# Patient Record
Sex: Male | Born: 1980 | Race: White | Hispanic: No | Marital: Married | Smoking: Former smoker
Health system: Southern US, Community
[De-identification: ages and names within clinical notes are randomized; demographics above are authoritative.]

## PROBLEM LIST (undated history)

## (undated) DIAGNOSIS — R519 Headache, unspecified: Secondary | ICD-10-CM

## (undated) DIAGNOSIS — G43909 Migraine, unspecified, not intractable, without status migrainosus: Secondary | ICD-10-CM

## (undated) DIAGNOSIS — R51 Headache: Secondary | ICD-10-CM

## (undated) HISTORY — PX: OTHER SURGICAL HISTORY: SHX169

---

## 2008-06-23 ENCOUNTER — Emergency Department: Payer: Self-pay | Admitting: Emergency Medicine

## 2010-02-19 ENCOUNTER — Emergency Department: Payer: Self-pay | Admitting: Emergency Medicine

## 2013-03-24 ENCOUNTER — Ambulatory Visit: Payer: Self-pay | Admitting: Family Medicine

## 2013-03-26 ENCOUNTER — Ambulatory Visit: Payer: Self-pay | Admitting: Emergency Medicine

## 2013-03-28 ENCOUNTER — Ambulatory Visit: Payer: Self-pay | Admitting: Internal Medicine

## 2013-03-30 ENCOUNTER — Ambulatory Visit: Payer: Self-pay | Admitting: Physician Assistant

## 2013-04-01 ENCOUNTER — Ambulatory Visit: Payer: Self-pay | Admitting: Physician Assistant

## 2013-04-03 ENCOUNTER — Ambulatory Visit: Payer: Self-pay | Admitting: Family Medicine

## 2014-03-10 ENCOUNTER — Ambulatory Visit: Payer: Self-pay

## 2014-04-17 ENCOUNTER — Ambulatory Visit: Payer: Self-pay | Admitting: Physician Assistant

## 2014-10-21 ENCOUNTER — Ambulatory Visit
Admission: EM | Admit: 2014-10-21 | Discharge: 2014-10-21 | Disposition: A | Discharge status: Home / Self Care | Payer: No Typology Code available for payment source | Attending: Internal Medicine | Admitting: Internal Medicine

## 2014-10-21 DIAGNOSIS — G43009 Migraine without aura, not intractable, without status migrainosus: Secondary | ICD-10-CM | POA: Diagnosis not present

## 2014-10-21 MED ORDER — SUMATRIPTAN SUCCINATE 6 MG/0.5ML ~~LOC~~ SOLN
6.0000 mg | Freq: Once | SUBCUTANEOUS | Status: AC
  Administered 2014-10-21: 6 mg via SUBCUTANEOUS

## 2014-10-21 MED ORDER — SUMATRIPTAN SUCCINATE 6 MG/0.5ML ~~LOC~~ SOAJ: 6.0000 mg | Freq: Once | SUBCUTANEOUS | Status: DC | PRN

## 2014-10-21 NOTE — ED Notes (Signed)
States started with headache left temporal area at 11am today. No N/V. States light bothersome

## 2014-10-21 NOTE — Discharge Instructions (Signed)
Prescription for imitrex.  If needing to use imitrex (or similar meds) more than 2-3 times per month, might benefit from a suppressive medication.  A primary care provider can help with this.  Recurrent Migraine Headache A migraine headache is an intense, throbbing pain on one or both sides of your head. Recurrent migraines keep coming back. A migraine can last for 30 minutes to several hours. CAUSES  The exact cause of a migraine headache is not always known. However, a migraine may be caused when nerves in the brain become irritated and release chemicals that cause inflammation. This causes pain. Certain things may also trigger migraines, such as:   Alcohol.  Smoking.  Stress.  Menstruation.  Aged cheeses.  Foods or drinks that contain nitrates, glutamate, aspartame, or tyramine.  Lack of sleep.  Chocolate.  Caffeine.  Hunger.  Physical exertion.  Fatigue.  Medicines used to treat chest pain (nitroglycerine), birth control pills, estrogen, and some blood pressure medicines. SYMPTOMS   Pain on one or both sides of your head.  Pulsating or throbbing pain.  Severe pain that prevents daily activities.  Pain that is aggravated by any physical activity.  Nausea, vomiting, or both.  Dizziness.  Pain with exposure to bright lights, loud noises, or activity.  General sensitivity to bright lights, loud noises, or smells. Before you get a migraine, you may get warning signs that a migraine is coming (aura). An aura may include:  Seeing flashing lights.  Seeing bright spots, halos, or zigzag lines.  Having tunnel vision or blurred vision.  Having feelings of numbness or tingling.  Having trouble talking.  Having muscle weakness. DIAGNOSIS  A recurrent migraine headache is often diagnosed based on:  Symptoms.  Physical examination.  A CT scan or MRI of your head. These imaging tests cannot diagnose migraines but can help rule out other causes of headaches.   TREATMENT  Medicines may be given for pain and nausea. Medicines can also be given to help prevent recurrent migraines. HOME CARE INSTRUCTIONS  Only take over-the-counter or prescription medicines for pain or discomfort as directed by your health care provider. The use of long-term narcotics is not recommended.  Lie down in a dark, quiet room when you have a migraine.  Keep a journal to find out what may trigger your migraine headaches. For example, write down:  What you eat and drink.  How much sleep you get.  Any change to your diet or medicines.  Limit alcohol consumption.  Quit smoking if you smoke.  Get 7-9 hours of sleep, or as recommended by your health care provider.  Limit stress.  Keep lights dim if bright lights bother you and make your migraines worse. SEEK MEDICAL CARE IF:   You do not get relief from the medicines given to you.  You have a recurrence of pain.  You have a fever. SEEK IMMEDIATE MEDICAL CARE IF:  Your migraine becomes severe.  You have a stiff neck.  You have loss of vision.  You have muscular weakness or loss of muscle control.  You start losing your balance or have trouble walking.  You feel faint or pass out.  You have severe symptoms that are different from your first symptoms. MAKE SURE YOU:   Understand these instructions.  Will watch your condition.  Will get help right away if you are not doing well or get worse. Document Released: 02/06/2001 Document Revised: 09/28/2013 Document Reviewed: 01/19/2013 [facility] Patient Information 2015 [place], [place]. This information is not  intended to replace advice given to you by your health care provider. Make sure you discuss any questions you have with your health care provider. ° °

## 2014-10-21 NOTE — ED Provider Notes (Signed)
CSN: [id number]     Arrival date & time 10/21/14  1846 History   First MD Initiated Contact with Patient 10/21/14 1941     Chief Complaint  Patient presents with  . Headache   HPI  Frequent headaches, this one is similar to previous.  No weakness/clumsiness, no loss of balance, able to drive self to urgent care and walk in independently.  Photophobic.  No speech/swallowing trouble.  No fever.  Current headache started 11am today, located at L temple.  Has used triptans successfully before.  No N/V.    Past Medical History  Diagnosis Date  . Migraine    History reviewed. No pertinent past surgical history. History reviewed. No pertinent family history. History  Substance Use Topics  . Smoking status: Former Games developer  . Smokeless tobacco: Current User  . Alcohol Use: No    Review of Systems  All other systems reviewed and are negative.   Allergies  Review of patient's allergies indicates no known allergies.  Home Medications   Prior to Admission medications   Not on File   BP 122/82 mmHg  Pulse 64  Temp(Src) 96.5 F (35.8 C) (Tympanic)  Resp 16  Ht  (1.854 m)  Wt 153 lb (69.4 kg)  BMI 20.19 kg/m2  SpO2 100% Physical Exam  Constitutional: He is oriented to person, place, and time. No distress.  Alert, nicely groomed  HENT:  Head: Atraumatic.  Eyes:  Conjugate gaze, no eye redness/drainage Wearing dark glasses  Neck: Neck supple.  Cardiovascular: Normal rate.   Pulmonary/Chest: No respiratory distress.  Lungs clear, symmetric breath sounds  Abdominal: Soft. He exhibits no distension.  Musculoskeletal: Normal range of motion.  Neurological: He is alert and oriented to person, place, and time.  Walked into urgent care independently.   Face symmetric. Speech clear/coherent.  Skin: Skin is warm and dry.  No cyanosis Tanned  Nursing note and vitals reviewed.   ED Course  Procedures (including critical care time) Labs Review Labs Reviewed - No data to  display  Imaging Review No results found.   imitrex  [place] injection given at urgent care for migraine, with complete relief of headache.   MDM   1. Nonintractable migraine, unspecified migraine type    rx for imitrex injections prn. Followup with a pcp may be beneficial to discuss migraine management plan.   Eustace Moore, MD 10/21/14 2017

## 2015-05-18 ENCOUNTER — Other Ambulatory Visit: Payer: Self-pay | Admitting: Neurology

## 2015-05-18 DIAGNOSIS — G44229 Chronic tension-type headache, not intractable: Secondary | ICD-10-CM

## 2015-06-09 ENCOUNTER — Ambulatory Visit: Payer: BLUE CROSS/BLUE SHIELD

## 2015-06-29 ENCOUNTER — Ambulatory Visit
Admission: RE | Admit: 2015-06-29 | Discharge: 2015-06-29 | Disposition: A | Discharge status: Home / Self Care | Payer: BLUE CROSS/BLUE SHIELD | Source: Ambulatory Visit | Attending: Neurology | Admitting: Neurology

## 2015-06-29 DIAGNOSIS — R9082 White matter disease, unspecified: Secondary | ICD-10-CM | POA: Insufficient documentation

## 2015-06-29 DIAGNOSIS — G44229 Chronic tension-type headache, not intractable: Secondary | ICD-10-CM | POA: Insufficient documentation

## 2015-07-14 ENCOUNTER — Other Ambulatory Visit: Payer: Self-pay | Admitting: Neurology

## 2015-07-14 DIAGNOSIS — G35 Multiple sclerosis: Secondary | ICD-10-CM

## 2015-07-25 ENCOUNTER — Observation Stay
Admission: RE | Admit: 2015-07-25 | Discharge: 2015-07-27 | Disposition: A | Discharge status: Home / Self Care | Payer: BLUE CROSS/BLUE SHIELD | Source: Ambulatory Visit | Attending: Internal Medicine | Admitting: Internal Medicine

## 2015-07-25 ENCOUNTER — Ambulatory Visit
Admission: RE | Admit: 2015-07-25 | Discharge: 2015-07-25 | Disposition: A | Discharge status: Home / Self Care | Payer: BLUE CROSS/BLUE SHIELD | Source: Ambulatory Visit | Attending: Neurology | Admitting: Neurology

## 2015-07-25 VITALS — BP 113/73 | HR 67 | Temp 97.8°F | Resp 20 | Ht 73.0 in | Wt 165.0 lb

## 2015-07-25 DIAGNOSIS — Z79899 Other long term (current) drug therapy: Secondary | ICD-10-CM | POA: Diagnosis not present

## 2015-07-25 DIAGNOSIS — Z825 Family history of asthma and other chronic lower respiratory diseases: Secondary | ICD-10-CM | POA: Diagnosis not present

## 2015-07-25 DIAGNOSIS — R9082 White matter disease, unspecified: Secondary | ICD-10-CM | POA: Diagnosis not present

## 2015-07-25 DIAGNOSIS — Z87891 Personal history of nicotine dependence: Secondary | ICD-10-CM | POA: Insufficient documentation

## 2015-07-25 DIAGNOSIS — M62830 Muscle spasm of back: Secondary | ICD-10-CM | POA: Diagnosis not present

## 2015-07-25 DIAGNOSIS — G35 Multiple sclerosis: Secondary | ICD-10-CM | POA: Diagnosis present

## 2015-07-25 DIAGNOSIS — M549 Dorsalgia, unspecified: Secondary | ICD-10-CM | POA: Insufficient documentation

## 2015-07-25 DIAGNOSIS — M545 Low back pain: Secondary | ICD-10-CM | POA: Insufficient documentation

## 2015-07-25 DIAGNOSIS — G43909 Migraine, unspecified, not intractable, without status migrainosus: Secondary | ICD-10-CM | POA: Diagnosis not present

## 2015-07-25 DIAGNOSIS — M542 Cervicalgia: Secondary | ICD-10-CM | POA: Insufficient documentation

## 2015-07-25 DIAGNOSIS — M4806 Spinal stenosis, lumbar region: Secondary | ICD-10-CM | POA: Insufficient documentation

## 2015-07-25 DIAGNOSIS — Z9889 Other specified postprocedural states: Secondary | ICD-10-CM | POA: Diagnosis not present

## 2015-07-25 DIAGNOSIS — M5186 Other intervertebral disc disorders, lumbar region: Secondary | ICD-10-CM | POA: Diagnosis not present

## 2015-07-25 DIAGNOSIS — F17220 Nicotine dependence, chewing tobacco, uncomplicated: Secondary | ICD-10-CM | POA: Insufficient documentation

## 2015-07-25 DIAGNOSIS — M546 Pain in thoracic spine: Secondary | ICD-10-CM

## 2015-07-25 HISTORY — DX: Headache, unspecified: R51.9

## 2015-07-25 HISTORY — DX: Migraine, unspecified, not intractable, without status migrainosus: G43.909

## 2015-07-25 HISTORY — DX: Headache: R51

## 2015-07-25 LAB — CSF CELL COUNT WITH DIFFERENTIAL
RBC Count, CSF: 0 /mm3 (ref 0–3)
RBC Count, CSF: 3 /mm3 (ref 0–3)
TUBE #: 3
TUBE #: 4
WBC, CSF: 0 /mm3
WBC, CSF: 0 /mm3

## 2015-07-25 LAB — CBC
HCT: 47 % (ref 40.0–52.0)
HEMOGLOBIN: 16 g/dL (ref 13.0–18.0)
MCH: 31.5 pg (ref 26.0–34.0)
MCHC: 34.1 g/dL (ref 32.0–36.0)
MCV: 92.6 fL (ref 80.0–100.0)
PLATELETS: 229 10*3/uL (ref 150–440)
RBC: 5.07 MIL/uL (ref 4.40–5.90)
RDW: 13.5 % (ref 11.5–14.5)
WBC: 5.4 10*3/uL (ref 3.8–10.6)

## 2015-07-25 LAB — ALBUMIN: Albumin: 4.5 g/dL (ref 3.5–5.0)

## 2015-07-25 LAB — GLUCOSE, CSF: Glucose, CSF: 63 mg/dL (ref 40–70)

## 2015-07-25 LAB — PROTIME-INR
INR: 0.93
PROTHROMBIN TIME: 12.7 s (ref 11.4–15.0)

## 2015-07-25 LAB — APTT: aPTT: 29 seconds (ref 24–36)

## 2015-07-25 LAB — PROTEIN, CSF: TOTAL PROTEIN, CSF: 23 mg/dL (ref 15–45)

## 2015-07-25 LAB — C-REACTIVE PROTEIN: CRP: <0.5 mg/dL (ref <1.0)

## 2015-07-25 LAB — SEDIMENTATION RATE: Sed Rate: 1 mm/hr (ref 0–15)

## 2015-07-25 MED ORDER — OXYCODONE HCL 5 MG PO TABS
5.0000 mg | ORAL_TABLET | ORAL | Status: DC | PRN
  Administered 2015-07-25 – 2015-07-27 (×5): 5 mg via ORAL
  Filled 2015-07-25 (×7): qty 1

## 2015-07-25 MED ORDER — OXYCODONE-ACETAMINOPHEN 5-325 MG PO TABS
1.0000 | ORAL_TABLET | Freq: Once | ORAL | Status: AC
  Administered 2015-07-25: 1 via ORAL
  Filled 2015-07-25: qty 1

## 2015-07-25 MED ORDER — MORPHINE SULFATE (PF) 2 MG/ML IV SOLN
2.0000 mg | INTRAVENOUS | Status: DC | PRN
  Administered 2015-07-25 – 2015-07-27 (×2): 2 mg via INTRAVENOUS
  Filled 2015-07-25 (×2): qty 1

## 2015-07-25 MED ORDER — OXYCODONE HCL 5 MG PO TABS
5.0000 mg | ORAL_TABLET | Freq: Once | ORAL | Status: AC
  Administered 2015-07-25: 5 mg via ORAL

## 2015-07-25 MED ORDER — ACETAMINOPHEN 650 MG RE SUPP
650.0000 mg | Freq: Four times a day (QID) | RECTAL | Status: DC | PRN
  Filled 2015-07-25: qty 1

## 2015-07-25 MED ORDER — CYCLOBENZAPRINE HCL 10 MG PO TABS
5.0000 mg | ORAL_TABLET | Freq: Three times a day (TID) | ORAL | Status: DC | PRN
  Administered 2015-07-25 – 2015-07-26 (×2): 5 mg via ORAL
  Filled 2015-07-25: qty 1
  Filled 2015-07-25: qty 0.5
  Filled 2015-07-25 (×2): qty 1

## 2015-07-25 MED ORDER — ACETAMINOPHEN 325 MG PO TABS
650.0000 mg | ORAL_TABLET | Freq: Four times a day (QID) | ORAL | Status: DC | PRN
  Filled 2015-07-25: qty 2

## 2015-07-25 MED ORDER — DIAZEPAM 5 MG PO TABS
5.0000 mg | ORAL_TABLET | Freq: Three times a day (TID) | ORAL | Status: DC | PRN
Start: 2015-07-25 — End: 2015-07-26
  Administered 2015-07-25: 5 mg via ORAL
  Filled 2015-07-25: qty 1

## 2015-07-25 MED ORDER — INFLUENZA VAC SPLIT QUAD 0.5 ML IM SUSY: 0.5000 mL | PREFILLED_SYRINGE | INTRAMUSCULAR | Status: DC

## 2015-07-25 MED ORDER — SUMATRIPTAN SUCCINATE 50 MG PO TABS
100.0000 mg | ORAL_TABLET | ORAL | Status: DC | PRN
  Administered 2015-07-26: 100 mg via ORAL
  Filled 2015-07-25 (×2): qty 2

## 2015-07-25 NOTE — OR Nursing (Signed)
Dr Register called, after pt unable to stand up to walk to bathroom, legs keep giving out. Unable to bear weight

## 2015-07-25 NOTE — Progress Notes (Signed)
Patient ID: Jordan Richards, male   DOB: [birth year], 35 y.o.   MRN: [id number] Pt. experenced sever back pain following L.P.Pt had difficulting walking and voiding secondary to pain.Pain localized to back,no radiation.no paresthesias.Vss.Neuro exam normal.Discussed with Dr. Dagoberto Reef consulted to further evaluate and manage pain.Thankyou-we are available as needed.

## 2015-07-25 NOTE — OR Nursing (Signed)
Able to void using urinal

## 2015-07-25 NOTE — Consult Note (Signed)
Reason for Consult:Back pain Referring Physician: Renae Gloss  CC: Back pain  HPI: Jordan Richards is an 35 y.o. male with a history of headaches who was at [facility] today to have an LP as an outpatient.  Procedure was without complications.  After the procedure when patient attempted to stand had severe back pain at the site of the procedure.  Patient was able to void.  Reports no changes in sensation in his legs and is able to move them, although painful, while in the lying position.    Past Medical History  Diagnosis Date  . Headache   . Migraines     Past Surgical History  Procedure Laterality Date  . Surgery as a child      Family History  Problem Relation Age of Onset  . COPD Mother   . Healthy Father     Social History:  reports that he has quit smoking. He uses smokeless tobacco. He reports that he does not drink alcohol or use illicit drugs.  No Known Allergies  Medications: I have reviewed the patient's current medications. Prior to Admission:  Prior to Admission medications   Medication Sig Start Date End Date Taking? Authorizing Provider  SUMAtriptan (IMITREX) 100 MG tablet Take 100 mg by mouth every 2 (two) hours as needed for migraine. May repeat in 2 hours if headache persists or recurs.   Yes Historical Provider, MD    ROS: History obtained from the patient  General ROS: negative for - chills, fatigue, fever, night sweats, weight gain or weight loss Psychological ROS: negative for - behavioral disorder, hallucinations, memory difficulties, mood swings or suicidal ideation Ophthalmic ROS: negative for - blurry vision, double vision, eye pain or loss of vision ENT ROS: negative for - epistaxis, nasal discharge, oral lesions, sore throat, tinnitus or vertigo Allergy and Immunology ROS: negative for - hives or itchy/watery eyes Hematological and Lymphatic ROS: negative for - bleeding problems, bruising or swollen lymph nodes Endocrine ROS: negative for - galactorrhea,  hair pattern changes, polydipsia/polyuria or temperature intolerance Respiratory ROS: negative for - cough, hemoptysis, shortness of breath or wheezing Cardiovascular ROS: negative for - chest pain, dyspnea on exertion, edema or irregular heartbeat Gastrointestinal ROS: negative for - abdominal pain, diarrhea, hematemesis, nausea/vomiting or stool incontinence Genito-Urinary ROS: negative for - dysuria, hematuria, incontinence or urinary frequency/urgency Musculoskeletal ROS: negative for - joint swelling or muscular weakness Neurological ROS: as noted in HPI Dermatological ROS: negative for rash and skin lesion changes  Physical Examination: Blood pressure 113/74, pulse 73, temperature 97.6 F (36.4 C), resp. rate 16, height 6\' 1"  (1.854 m), weight 74.844 kg (165 lb), SpO2 97 %.  HEENT-  Normocephalic, no lesions, without obvious abnormality.  Normal external eye and conjunctiva.  Normal TM's bilaterally.  Normal auditory canals and external ears. Normal external nose, mucus membranes and septum.  Normal pharynx. Cardiovascular- S1, S2 normal, pulses palpable throughout   Lungs- chest clear, no wheezing, rales, normal symmetric air entry Abdomen- soft, non-tender; bowel sounds normal; no masses,  no organomegaly Extremities- no edema Lymph-no adenopathy palpable Musculoskeletal-no joint tenderness, deformity or swelling.  No pain on palpation of the spinous processes or paraspinal musculature.   Skin-warm and dry, no hyperpigmentation, vitiligo, or suspicious lesions  Neurological Examination Mental Status: Alert, oriented, thought content appropriate.  Speech fluent without evidence of aphasia.  Able to follow 3 step commands without difficulty. Motor: Right : Upper extremity   5/5    Left:     Upper extremity  5/5 Able to move all muscle groups in the lower extremities against gravity but patient guarded due to pain.   Sensory: Pinprick and light touch intact throughout,  bilaterally Deep Tendon Reflexes: 2+ and symmetric throughout Plantars: Right: downgoing   Left: downgoing Gait: Not tested due to pain and safety concerns  Laboratory Studies:   Basic Metabolic Panel: No results for input(s): NA, K, CL, CO2, GLUCOSE, BUN, CREATININE, CALCIUM, MG, PHOS in the last 168 hours.  Liver Function Tests:  Recent Labs Lab 07/25/15 0751  ALBUMIN 4.5   No results for input(s): LIPASE, AMYLASE in the last 168 hours. No results for input(s): AMMONIA in the last 168 hours.  CBC:  Recent Labs Lab 07/25/15 0751  WBC 5.4  HGB 16.0  HCT 47.0  MCV 92.6  PLT 229    Cardiac Enzymes: No results for input(s): CKTOTAL, CKMB, CKMBINDEX, TROPONINI in the last 168 hours.  BNP: Invalid input(s): POCBNP  CBG: No results for input(s): GLUCAP in the last 168 hours.  Microbiology: Results for orders placed or performed during the hospital encounter of 07/25/15  CSF culture     Status: None (Preliminary result)   Collection Time: 07/25/15  8:20 AM  Result Value Ref Range Status   Specimen Description CSF  Final   Special Requests NONE  Final   Gram Stain RARE WBC SEEN NO ORGANISMS SEEN   Final   Culture PENDING  Incomplete   Report Status PENDING  Incomplete    Coagulation Studies:  Recent Labs  07/25/15 0751  LABPROT 12.7  INR 0.93    Urinalysis: No results for input(s): COLORURINE, LABSPEC, PHURINE, GLUCOSEU, HGBUR, BILIRUBINUR, KETONESUR, PROTEINUR, UROBILINOGEN, NITRITE, LEUKOCYTESUR in the last 168 hours.  Invalid input(s): APPERANCEUR  Lipid Panel:  No results found for: CHOL, TRIG, HDL, CHOLHDL, VLDL, LDLCALC  HgbA1C: No results found for: HGBA1C  Urine Drug Screen:  No results found for: LABOPIA, COCAINSCRNUR, LABBENZ, AMPHETMU, THCU, LABBARB  Alcohol Level: No results for input(s): ETH in the last 168 hours.   Imaging: Dg Fluoro Guided Loc Of Needle/cath Tip For Spinal Inject Lt  07/25/2015  CLINICAL DATA:  Headaches. EXAM:  DIAGNOSTIC LUMBAR PUNCTURE UNDER FLUOROSCOPIC GUIDANCE FLUOROSCOPY TIME:  Radiation Exposure Index (as provided by the fluoroscopic device): 14.5 mGy PROCEDURE: Informed consent was obtained from the patient prior to the procedure, including potential complications of headache, allergy, and pain. With the patient prone, the lower back was prepped with Betadine. 1% Lidocaine was used for local anesthesia. Lumbar puncture was performed at the L3-L4 level using a 22 gauge needle with return of clear CSF with an opening pressure of 14 cm water. Eight ml of CSF were obtained for laboratory studies. The patient tolerated the procedure well and there were no apparent complications. IMPRESSION: Successful fluoroscopically directed lumbar puncture. Electronically Signed   By: Maisie Fus  Register   On: 07/25/2015 08:59     Assessment/Plan: 35 year old male presenting for an LP to rule out MS.  After procedure with severe back pain.  No evidence of bladder incontinence.  No sensory findings.  Deep tendon reflexes are normal.  Due to severity of pain will rule out a hemorrhage related to the procedure.    Recommendations: 1.  CT of the lumbar spine.  Will follow up after above imaging.   2.  Valium 5-10 mg for prn for pain    Thana Farr, MD Neurology [phone] 07/25/2015, 1:14 PM   Addendum: CT of the lumbar spine personally reviewed and  shows no pathology that would be the etiology of the patient's pain.  Suspect pain is muscular.  Would continue with plan for muscle relaxer.    Thana Farr, MD Neurology [phone]

## 2015-07-25 NOTE — OR Nursing (Signed)
Dr register to assess pt, ordered another percocet

## 2015-07-25 NOTE — OR Nursing (Signed)
Pain continuing, still unable to bear weight to void, Dr register reassessed pt and Dr Malvin Johns office called, per Dr register request.

## 2015-07-25 NOTE — H&P (Signed)
Jordan Richards at [place] NAME: Jordan Richards    MR#:  [id number]  DATE OF BIRTH:  Nov 03, 1980  DATE OF ADMISSION:  07/25/2015  PRIMARY CARE PHYSICIAN: Cecile Sheerer, MD   REQUESTING/REFERRING PHYSICIAN: Dr Elige Radon  CHIEF COMPLAINT:  Back pain  HISTORY OF PRESENT ILLNESS:  Jordan Richards  is a 35 y.o. male with a known history of migraines. He follows with Dr. Melrose Nakayama neurology who obtained an MRI of the brain and they saw an area that they could not rule out multiple sclerosis and he was sent for spinal tap. He had a spinal tap today that was done by Dr. register under fluoroscopy. His opening pressure was normal and the procedure was uncomplicated as per Dr. Production manager. The patient had severe back pain and spasms after the procedure. He could not even stand up and bending or twisting has him in severe pain. He has trouble urinating sitting down and he could not even stand up to urinate. He eventually urinated. He does not have any pain radiating down his legs and sensation is normal in his legs. But when he moves his legs he does have severe pain in his back around the site of the lumbar puncture. Back pain severe 10 out of 10 intensity when moving around. Localized) at lumbar puncture site.  PAST MEDICAL HISTORY:   Past Medical History  Diagnosis Date  . Headache   . Migraines     PAST SURGICAL HISTORY:   Past Surgical History  Procedure Laterality Date  . Surgery as a child      SOCIAL HISTORY:   Social History  Substance Use Topics  . Smoking status: Former Research scientist (life sciences)  . Smokeless tobacco: Current User  . Alcohol Use: No    FAMILY HISTORY:   Family History  Problem Relation Age of Onset  . COPD Mother   . Healthy Father     DRUG ALLERGIES:  No Known Allergies  REVIEW OF SYSTEMS:  CONSTITUTIONAL: No fever, fatigue or weakness. Some weight gain EYES: No blurred or double vision. Weakness in the right eye EARS,  NOSE, AND THROAT: No tinnitus or ear pain. No sore throat RESPIRATORY: No cough, some shortness of breath, no wheezing or hemoptysis.  CARDIOVASCULAR: No chest pain, orthopnea, edema.  GASTROINTESTINAL: No nausea, vomiting, diarrhea or abdominal pain. No blood in bowel movements GENITOURINARY: No dysuria, hematuria. Trouble urinating and less he is standing up. Unable to stand secondary to back pain did urinate. ENDOCRINE: No polyuria, nocturia,  HEMATOLOGY: No anemia, easy bruising or bleeding SKIN: No rash or lesion. Itching when he lifts up his arms MUSCULOSKELETAL: No joint pain or arthritis.   NEUROLOGIC: No tingling, numbness, weakness.  PSYCHIATRY: No anxiety or depression.   MEDICATIONS AT HOME:   Prior to Admission medications   Medication Sig Start Date End Date Taking? Authorizing Provider  SUMAtriptan (IMITREX) 100 MG tablet Take 100 mg by mouth every 2 (two) hours as needed for migraine. May repeat in 2 hours if headache persists or recurs.   Yes Historical Provider, MD      VITAL SIGNS:  Blood pressure 114/82, pulse 67, temperature 97.6 F (36.4 C), resp. rate 16, height 6' 1"  (1.854 m), weight 74.844 kg (165 lb), SpO2 100 %.  PHYSICAL EXAMINATION:  GENERAL:  35 y.o.-year-old patient lying in the bed with no acute distress.  EYES: Pupils equal, round, reactive to light and accommodation. No scleral icterus. Extraocular muscles intact.  HEENT: Head  atraumatic, normocephalic. Oropharynx and nasopharynx clear.  NECK:  Supple, no jugular venous distention. No thyroid enlargement, no tenderness.  LUNGS: Normal breath sounds bilaterally, no wheezing, rales,rhonchi or crepitation. No use of accessory muscles of respiration.  CARDIOVASCULAR: S1, S2 normal. No murmurs, rubs, or gallops.  ABDOMEN: Soft, nontender, nondistended. Bowel sounds present. No organomegaly or mass.  EXTREMITIES: No pedal edema, cyanosis, or clubbing.  NEUROLOGIC: Cranial nerves II through XII are intact.  Sensation intact to light touch.. Gait not checked. Reflexes 3+ bilateral lower extremity. Patient with severe pain in his back with straight leg raise bilaterally and also with ankle flexion and extension. Patient able to do these things. PSYCHIATRIC: The patient is alert and oriented x 3.  SKIN: No rash, lesion, or ulcer. Slight redness of the skin on the back.  LABORATORY PANEL:   CBC  Recent Labs Lab 07/25/15 0751  WBC 5.4  HGB 16.0  HCT 47.0  PLT 229   ------------------------------------------------------------------------------------------------------------------    RADIOLOGY:  Dg Fluoro Guided Loc Of Needle/cath Tip For Spinal Inject Lt  07/25/2015  CLINICAL DATA:  Headaches. EXAM: DIAGNOSTIC LUMBAR PUNCTURE UNDER FLUOROSCOPIC GUIDANCE FLUOROSCOPY TIME:  Radiation Exposure Index (as provided by the fluoroscopic device): 14.5 mGy PROCEDURE: Informed consent was obtained from the patient prior to the procedure, including potential complications of headache, allergy, and pain. With the patient prone, the lower back was prepped with Betadine. 1% Lidocaine was used for local anesthesia. Lumbar puncture was performed at the L3-L4 level using a 22 gauge needle with return of clear CSF with an opening pressure of 14 cm water. Eight ml of CSF were obtained for laboratory studies. The patient tolerated the procedure well and there were no apparent complications. IMPRESSION: Successful fluoroscopically directed lumbar puncture. Electronically Signed   By: Marcello Moores  Register   On: 07/25/2015 08:59     IMPRESSION AND PLAN:   1. Severe back pain status post lumbar puncture. Patient did have some trouble urinating because he could not stand up. The patient is able to move his legs and does not have any sensory or pain radiating down his legs. Patient having quite a bit of pain when moving his legs. I spoke with neurology Dr. Alexis Goodell to see the patient in consultation to evaluate whether  the patient needs imaging at this point. Will bring in as an observation. Pain control with oral and IV medications and muscle relaxant. If patient continues to still have pain for prolonged period of time can consider a blood patch but too early at this point. So for spinal fluid results are unremarkable and this was a non-traumatic tap with 0 red blood cells seen on 23 and only 3 red blood cells on tube 4. 2. History of chronic headaches- patient takes Imitrex when necessary 3. 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 Nonspecific considerations of small vessel ischemia, demyelinating disease, migraines, hypercoagulable state and/or vasculitis. And on an ESR and CRP to labs drawn.  All the records are reviewed and case discussed with ED provider. Management plans discussed with the patient, family and they are in agreement.  CODE STATUS: full code  TOTAL TIME TAKING CARE OF THIS PATIENT: 50  minutes.    Loletha Grayer M.D on 07/25/2015 at 12:50 PM  Between 7am to 6pm - Pager - [phone]  After 6pm call admission pager [facility]  Office  [phone]  CC: Primary care physician; Shapely-Quinn, Okey Regal, MD

## 2015-07-25 NOTE — OR Nursing (Signed)
Dr register told pt that he discussed case with Dr Malvin Johns, if his pain/spasms dont stop by 12 noon, he would need to be sent to ER and assessed for possible admission for pain control

## 2015-07-26 DIAGNOSIS — M545 Low back pain: Secondary | ICD-10-CM | POA: Diagnosis not present

## 2015-07-26 DIAGNOSIS — G43909 Migraine, unspecified, not intractable, without status migrainosus: Secondary | ICD-10-CM | POA: Diagnosis not present

## 2015-07-26 LAB — CBC
HCT: 46.1 % (ref 40.0–52.0)
Hemoglobin: 16 g/dL (ref 13.0–18.0)
MCH: 31.7 pg (ref 26.0–34.0)
MCHC: 34.6 g/dL (ref 32.0–36.0)
MCV: 91.4 fL (ref 80.0–100.0)
PLATELETS: 215 10*3/uL (ref 150–440)
RBC: 5.05 MIL/uL (ref 4.40–5.90)
RDW: 13.7 % (ref 11.5–14.5)
WBC: 6.9 10*3/uL (ref 3.8–10.6)

## 2015-07-26 LAB — BASIC METABOLIC PANEL
ANION GAP: 5 (ref 5–15)
BUN: 19 mg/dL (ref 6–20)
CALCIUM: 9.1 mg/dL (ref 8.9–10.3)
CO2: 27 mmol/L (ref 22–32)
CREATININE: 0.95 mg/dL (ref 0.61–1.24)
Chloride: 109 mmol/L (ref 101–111)
GFR calc non Af Amer: >60 mL/min (ref >60)
Glucose, Bld: 102 mg/dL — ABNORMAL HIGH (ref 65–99)
Potassium: 4.4 mmol/L (ref 3.5–5.1)
SODIUM: 141 mmol/L (ref 135–145)

## 2015-07-26 MED ORDER — METHOCARBAMOL 1000 MG/10ML IJ SOLN
500.0000 mg | Freq: Once | INTRAVENOUS | Status: AC
  Administered 2015-07-26: 500 mg via INTRAVENOUS
  Filled 2015-07-26 (×2): qty 5

## 2015-07-26 MED ORDER — DIPHENHYDRAMINE HCL 50 MG/ML IJ SOLN
25.0000 mg | Freq: Once | INTRAMUSCULAR | Status: AC
  Administered 2015-07-26: 25 mg via INTRAVENOUS
  Filled 2015-07-26: qty 1

## 2015-07-26 MED ORDER — DIAZEPAM 5 MG PO TABS
5.0000 mg | ORAL_TABLET | Freq: Three times a day (TID) | ORAL | Status: DC
  Administered 2015-07-26 – 2015-07-27 (×3): 5 mg via ORAL
  Filled 2015-07-26 (×3): qty 1

## 2015-07-26 MED ORDER — METOCLOPRAMIDE HCL 5 MG/ML IJ SOLN
10.0000 mg | Freq: Once | INTRAMUSCULAR | Status: AC
  Administered 2015-07-26: 10 mg via INTRAVENOUS
  Filled 2015-07-26: qty 2

## 2015-07-26 NOTE — Progress Notes (Signed)
[facility] - [place] at [facility]                                                                                                                                                                                            Patient Demographics   Jordan Richards, is a 35 y.o. male, DOB - Mar 20, 1981, YNW:[id number]  Admit date - 07/25/2015   Admitting Physician Alford Highland, MD  Outpatient Primary MD for the patient is Shapely-Quinn, Desiree Lucy, MD   LOS -   Subjective: Patient complained of sharp headache earlier today continues to have some back pain that  was able to ambulate     Review of Systems:   CONSTITUTIONAL: No documented fever. No fatigue, weakness. No weight gain, no weight loss.  EYES: No blurry or double vision.  ENT: No tinnitus. No postnasal drip. No redness of the oropharynx.  RESPIRATORY: No cough, no wheeze, no hemoptysis. No dyspnea.  CARDIOVASCULAR: No chest pain. No orthopnea. No palpitations. No syncope.  GASTROINTESTINAL: No nausea, no vomiting or diarrhea. No abdominal pain. No melena or hematochezia.  GENITOURINARY: No dysuria or hematuria.  ENDOCRINE: No polyuria or nocturia. No heat or cold intolerance.  HEMATOLOGY: No anemia. No bruising. No bleeding.  INTEGUMENTARY: No rashes. No lesions.  MUSCULOSKELETAL: No arthritis. No swelling. No gout. Positive Back pain positive for headache NEUROLOGIC: No numbness, tingling, or ataxia. No seizure-type activity.  PSYCHIATRIC: No anxiety. No insomnia. No ADD.    Vitals:   Filed Vitals:   07/26/15 0312 07/26/15 0830 07/26/15 1109 07/26/15 1123  BP: 124/77 109/67 112/85 128/84  Pulse: 58 63 69 77  Temp: 97.3 F (36.3 C) 98 F (36.7 C) 97.8 F (36.6 C) 98 F (36.7 C)  TempSrc: Oral Oral Oral Oral  Resp: 18     Height:      Weight:      SpO2: 100% 99% 100% 98%    Wt Readings from Last 3 Encounters:  07/25/15 74.844 kg (165 lb)  10/21/14 69.4 kg (153 lb)     Intake/Output  Summary (Last 24 hours) at 07/26/15 1508 Last data filed at 07/26/15 1446  Gross per 24 hour  Intake    415 ml  Output    700 ml  Net   -285 ml    Physical Exam:   GENERAL: Pleasant-appearing in no apparent distress.  HEAD, EYES, EARS, NOSE AND THROAT: Atraumatic, normocephalic. Extraocular muscles are intact. Pupils equal and reactive to light. Sclerae anicteric. No conjunctival injection. No oro-pharyngeal erythema.  NECK: Supple. There is no jugular venous distention. No bruits, no lymphadenopathy, no thyromegaly.  HEART:  Regular rate and rhythm,. No murmurs, no rubs, no clicks.  LUNGS: Clear to auscultation bilaterally. No rales or rhonchi. No wheezes.  ABDOMEN: Soft, flat, nontender, nondistended. Has good bowel sounds. No hepatosplenomegaly appreciated.  EXTREMITIES: No evidence of any cyanosis, clubbing, or peripheral edema.  +2 pedal and radial pulses bilaterally.  NEUROLOGIC: The patient is alert, awake, and oriented x3 with no focal motor or sensory deficits appreciated bilaterally.  SKIN: Moist and warm with no rashes appreciated.  Psych: Not anxious, depressed LN: No inguinal LN enlargement    Antibiotics   Anti-infectives    None      Medications   Scheduled Meds: . diazepam  5 mg Oral 3 times per day  . Influenza vac split quadrivalent PF  0.5 mL Intramuscular Tomorrow-1000   Continuous Infusions:  PRN Meds:.acetaminophen **OR** acetaminophen, cyclobenzaprine, morphine injection, oxyCODONE, SUMAtriptan   Data Review:   Micro Results Recent Results (from the past 240 hour(s))  CSF culture     Status: None (Preliminary result)   Collection Time: 07/25/15  8:20 AM  Result Value Ref Range Status   Specimen Description CSF  Final   Special Requests NONE  Final   Gram Stain RARE WBC SEEN NO ORGANISMS SEEN   Final   Culture NO GROWTH < 24 HOURS  Final   Report Status PENDING  Incomplete    Radiology Reports Ct Lumbar Spine Wo Contrast  07/25/2015   CLINICAL DATA:  Severe back pain and spasms following lumbar puncture today. Difficulty urinating. The patient denies Pattricia Boss radicular symptoms. EXAM: CT LUMBAR SPINE WITHOUT CONTRAST TECHNIQUE: Multidetector CT imaging of the lumbar spine was performed without intravenous contrast administration. Multiplanar CT image reconstructions were also generated. COMPARISON:  Single oblique view of the lumbar spine from the fluoroscopic guided lumbar puncture 2 day. FINDINGS: Limited imaging of the abdomen is within normal limits. There is no significant adenopathy. 5 non rib-bearing lumbar type vertebral bodies are present. The vertebral body heights and alignment are maintained. The thecal sac appears to be within normal limits throughout the lumbar spine. There is no significant subdural or epidural collection. There is no distortion of the central canal. The L3-4 level, where the puncture was performed, is unremarkable. L4-5: A mild rightward disc bulge is present. Facet hypertrophy contributes to mild subarticular narrowing bilaterally. L5-S1:  Negative. IMPRESSION: 1. No evidence for significant epidural or subdural collection at the level of the lumbar puncture, L3-4. 2. No significant mass effect to explain the patient's symptoms following lumbar puncture today. 3. Mild rightward disc bulge and bilateral facet hypertrophy contribute to mild bilateral subarticular narrowing. Electronically Signed   By: Marin Roberts M.D.   On: 07/25/2015 15:09   Mr Maxine Glenn Head Wo Contrast  06/29/2015  CLINICAL DATA:  Temporal and posterior headaches. Pain behind the left eye. Posterior neck pain. Worsening symptoms for 10 years. EXAM: MRI HEAD WITHOUT CONTRAST MRA HEAD WITHOUT CONTRAST TECHNIQUE: Multiplanar, multiecho pulse sequences of the brain and surrounding structures were obtained without intravenous contrast. Angiographic images of the head were obtained using MRA technique without contrast. COMPARISON:  None. FINDINGS:  MRI HEAD FINDINGS Some sequences are mildly motion degraded. There is no evidence of acute infarct, intracranial hemorrhage, mass, midline shift, or extra-axial fluid collection. Ventricles and sulci are normal. There is a 1.7 x 1.0 cm region of confluent T2 hyperintensity in the right parietal white matter adjacent to the atrium of the lateral ventricle. Faint left periatrial white matter T2 hyperintensity is noted. There is  a 1 cm focus of subcortical white matter T2 hyperintensity in the posterior right frontal lobe. Orbits are unremarkable. Minimal right ethmoid sinus mucosal thickening is noted. Mastoid air cells are clear. Major intracranial vascular flow voids are preserved. MRA HEAD FINDINGS The study is moderately motion degraded. The intracranial vertebral arteries or not imaged. The vertebrobasilar junction is patent. Basilar artery is patent without evidence of stenosis. SCA origins are patent. PCAs are patent with artifact versus mild stenosis near the right P1 - P2 junction. Internal carotid arteries are patent from skullbase to carotid termini. No flow limiting ICA stenosis is identified, however evaluation is significantly limited by motion artifact, particularly through the petrous and proximal supraclinoid segments. ACAs and MCAs are patent without evidence of significant proximal stenosis within limitations of motion artifact. No sizable intracranial aneurysm is identified, although sensitivity for detection of very small aneurysms is reduced by the degree of motion. IMPRESSION: 1. No acute intracranial abnormality or mass. 2. Cerebral white matter disease which is abnormal for age and most notable in the right periatrial region. This is nonspecific with considerations including chronic small vessel ischemia, demyelinating disease, migraines, sequelae of prior trauma or infection, hypercoagulable state, and vasculitis. 3. Moderately motion degraded MRA without evidence of major intracranial  arterial occlusion, flow-limiting proximal stenosis, or sizable aneurysm. Electronically Signed   By: Sebastian Ache M.D.   On: 06/29/2015 13:47   Mr Brain Wo Contrast  06/29/2015  CLINICAL DATA:  Temporal and posterior headaches. Pain behind the left eye. Posterior neck pain. Worsening symptoms for 10 years. EXAM: MRI HEAD WITHOUT CONTRAST MRA HEAD WITHOUT CONTRAST TECHNIQUE: Multiplanar, multiecho pulse sequences of the brain and surrounding structures were obtained without intravenous contrast. Angiographic images of the head were obtained using MRA technique without contrast. COMPARISON:  None. FINDINGS: MRI HEAD FINDINGS Some sequences are mildly motion degraded. There is no evidence of acute infarct, intracranial hemorrhage, mass, midline shift, or extra-axial fluid collection. Ventricles and sulci are normal. There is a 1.7 x 1.0 cm region of confluent T2 hyperintensity in the right parietal white matter adjacent to the atrium of the lateral ventricle. Faint left periatrial white matter T2 hyperintensity is noted. There is a 1 cm focus of subcortical white matter T2 hyperintensity in the posterior right frontal lobe. Orbits are unremarkable. Minimal right ethmoid sinus mucosal thickening is noted. Mastoid air cells are clear. Major intracranial vascular flow voids are preserved. MRA HEAD FINDINGS The study is moderately motion degraded. The intracranial vertebral arteries or not imaged. The vertebrobasilar junction is patent. Basilar artery is patent without evidence of stenosis. SCA origins are patent. PCAs are patent with artifact versus mild stenosis near the right P1 - P2 junction. Internal carotid arteries are patent from skullbase to carotid termini. No flow limiting ICA stenosis is identified, however evaluation is significantly limited by motion artifact, particularly through the petrous and proximal supraclinoid segments. ACAs and MCAs are patent without evidence of significant proximal stenosis  within limitations of motion artifact. No sizable intracranial aneurysm is identified, although sensitivity for detection of very small aneurysms is reduced by the degree of motion. IMPRESSION: 1. No acute intracranial abnormality or mass. 2. Cerebral white matter disease which is abnormal for age and most notable in the right periatrial region. This is nonspecific with considerations including chronic small vessel ischemia, demyelinating disease, migraines, sequelae of prior trauma or infection, hypercoagulable state, and vasculitis. 3. Moderately motion degraded MRA without evidence of major intracranial arterial occlusion, flow-limiting proximal stenosis, or sizable  aneurysm. Electronically Signed   By: Sebastian Ache M.D.   On: 06/29/2015 13:47   Dg Fluoro Guided Loc Of Needle/cath Tip For Spinal Inject Lt  07/25/2015  CLINICAL DATA:  Headaches. EXAM: DIAGNOSTIC LUMBAR PUNCTURE UNDER FLUOROSCOPIC GUIDANCE FLUOROSCOPY TIME:  Radiation Exposure Index (as provided by the fluoroscopic device): 14.5 mGy PROCEDURE: Informed consent was obtained from the patient prior to the procedure, including potential complications of headache, allergy, and pain. With the patient prone, the lower back was prepped with Betadine. 1% Lidocaine was used for local anesthesia. Lumbar puncture was performed at the L3-L4 level using a 22 gauge needle with return of clear CSF with an opening pressure of 14 cm water. Eight ml of CSF were obtained for laboratory studies. The patient tolerated the procedure well and there were no apparent complications. IMPRESSION: Successful fluoroscopically directed lumbar puncture. Electronically Signed   By: Maisie Fus  Register   On: 07/25/2015 08:59     CBC  Recent Labs Lab 07/25/15 0751 07/26/15 0616  WBC 5.4 6.9  HGB 16.0 16.0  HCT 47.0 46.1  PLT 229 215  MCV 92.6 91.4  MCH 31.5 31.7  MCHC 34.1 34.6  RDW 13.5 13.7    Chemistries   Recent Labs Lab 07/26/15 0616  NA 141  K 4.4  CL  109  CO2 27  GLUCOSE 102*  BUN 19  CREATININE 0.95  CALCIUM 9.1   ------------------------------------------------------------------------------------------------------------------ estimated creatinine clearance is 115.9 mL/min (by C-G formula based on Cr of 0.95). ------------------------------------------------------------------------------------------------------------------ No results for input(s): HGBA1C in the last 72 hours. ------------------------------------------------------------------------------------------------------------------ No results for input(s): CHOL, HDL, LDLCALC, TRIG, CHOLHDL, LDLDIRECT in the last 72 hours. ------------------------------------------------------------------------------------------------------------------ No results for input(s): TSH, T4TOTAL, T3FREE, THYROIDAB in the last 72 hours.  Invalid input(s): FREET3 ------------------------------------------------------------------------------------------------------------------ No results for input(s): VITAMINB12, FOLATE, FERRITIN, TIBC, IRON, RETICCTPCT in the last 72 hours.  Coagulation profile  Recent Labs Lab 07/25/15 0751  INR 0.93    No results for input(s): DDIMER in the last 72 hours.  Cardiac Enzymes No results for input(s): CKMB, TROPONINI, MYOGLOBIN in the last 168 hours.  Invalid input(s): CK ------------------------------------------------------------------------------------------------------------------ Invalid input(s): POCBNP    Assessment & Plan   1. Severe back pain status post lumbar puncture.  CT scan of the lower back without any evidence of hematoma, no-year-old deficits appreciated neurology input  2. Acute migraine attack status post treatment as per neurology now improved 3. Cerebral white matter disease  Neurology felt to be due to migraines. We'll need to follow-up with his primary neurologist     Code Status Orders        Start     Ordered   07/25/15  1247  Full code   Continuous     07/25/15 1247    Code Status History    Date Active Date Inactive Code Status Order ID Comments User Context   This patient has a current code status but no historical code status.           Consults neuro  DVT Prophylaxis  ambulatory  Lab Results  Component Value Date   PLT 215 07/26/2015     Time Spent in minutes 25 minutes  Greater than 50% of time spent in care coordination and counseling patient regarding the condition and plan of care.   Auburn Bilberry M.D on 07/26/2015 at 3:08 PM  Between 7am to 6pm - Pager - [phone]  After 6pm go to [email or web address] - password [email or web address]  [facility]   Office  [phone]   

## 2015-07-26 NOTE — Progress Notes (Signed)
Subjective: After Flexeril and Valium patient did well with pain last evening and was able to walk around in the room.  This AM when he attempted to get up his pain was worse.  Radiates up his back and into his ribs at this time making it difficult for him to breathe.  Also reports a migraine that was not helped Imitrex.    Objective: Current vital signs: BP 109/67 mmHg  Pulse 63  Temp(Src) 98 F (36.7 C) (Oral)  Resp 18  Ht  (1.854 m)  Wt 74.844 kg (165 lb)  BMI 21.77 kg/m2  SpO2 99% Vital signs in last 24 hours: Temp:  [97.3 F (36.3 C)-98.7 F (37.1 C)] 98 F (36.7 C) (02/28 0830) Pulse Rate:  [50-73] 63 (02/28 0830) Resp:  [16-20] 18 (02/28 0312) BP: (101-136)/(67-106) 109/67 mmHg (02/28 0830) SpO2:  [95 %-100 %] 99 % (02/28 0830)  Intake/Output from previous day: 02/27 0701 - 02/28 0700 In: -  Out: 700 [Urine:700] Intake/Output this shift:   Nutritional status: Diet regular Room service appropriate?: Yes; Fluid consistency:: Thin  Neurologic Exam: Mental Status: Alert, oriented, thought content appropriate. Speech fluent without evidence of aphasia. Able to follow 3 step commands without difficulty. Motor: Right :Upper extremity 5/5Left: Upper extremity 5/5 Able to move all muscle groups in the lower extremities against gravity but patient guarded due to pain.  Sensory: Pinprick and light touch intact throughout, bilaterally Deep Tendon Reflexes: 2+ and symmetric throughout Plantars:   Lab Results: Basic Metabolic Panel:  Recent Labs Lab 07/26/15 0616  NA 141  K 4.4  CL 109  CO2 27  GLUCOSE 102*  BUN 19  CREATININE 0.95  CALCIUM 9.1    Liver Function Tests:  Recent Labs Lab 07/25/15 0751  ALBUMIN 4.5   No results for input(s): LIPASE, AMYLASE in the last 168 hours. No results for input(s): AMMONIA in the last 168 hours.  CBC:  Recent Labs Lab 07/25/15 0751 07/26/15 0616  WBC 5.4 6.9   HGB 16.0 16.0  HCT 47.0 46.1  MCV 92.6 91.4  PLT 229 215    Cardiac Enzymes: No results for input(s): CKTOTAL, CKMB, CKMBINDEX, TROPONINI in the last 168 hours.  Lipid Panel: No results for input(s): CHOL, TRIG, HDL, CHOLHDL, VLDL, LDLCALC in the last 168 hours.  CBG: No results for input(s): GLUCAP in the last 168 hours.  Microbiology: Results for orders placed or performed during the hospital encounter of 07/25/15  CSF culture     Status: None (Preliminary result)   Collection Time: 07/25/15  8:20 AM  Result Value Ref Range Status   Specimen Description CSF  Final   Special Requests NONE  Final   Gram Stain RARE WBC SEEN NO ORGANISMS SEEN   Final   Culture NO GROWTH < 24 HOURS  Final   Report Status PENDING  Incomplete    Coagulation Studies:  Recent Labs  07/25/15 0751  LABPROT 12.7  INR 0.93    Imaging: Ct Lumbar Spine Wo Contrast  07/25/2015  CLINICAL DATA:  Severe back pain and spasms following lumbar puncture today. Difficulty urinating. The patient denies Pattricia Boss radicular symptoms. EXAM: CT LUMBAR SPINE WITHOUT CONTRAST TECHNIQUE: Multidetector CT imaging of the lumbar spine was performed without intravenous contrast administration. Multiplanar CT image reconstructions were also generated. COMPARISON:  Single oblique view of the lumbar spine from the fluoroscopic guided lumbar puncture 2 day. FINDINGS: Limited imaging of the abdomen is within normal limits. There is no significant adenopathy. 5 non  rib-bearing lumbar type vertebral bodies are present. The vertebral body heights and alignment are maintained. The thecal sac appears to be within normal limits throughout the lumbar spine. There is no significant subdural or epidural collection. There is no distortion of the central canal. The L3-4 level, where the puncture was performed, is unremarkable. L4-5: A mild rightward disc bulge is present. Facet hypertrophy contributes to mild subarticular narrowing bilaterally.  L5-S1:  Negative. IMPRESSION: 1. No evidence for significant epidural or subdural collection at the level of the lumbar puncture, L3-4. 2. No significant mass effect to explain the patient's symptoms following lumbar puncture today. 3. Mild rightward disc bulge and bilateral facet hypertrophy contribute to mild bilateral subarticular narrowing. Electronically Signed   By: Marin Roberts M.D.   On: 07/25/2015 15:09   Dg Fluoro Guided Loc Of Needle/cath Tip For Spinal Inject Lt  07/25/2015  CLINICAL DATA:  Headaches. EXAM: DIAGNOSTIC LUMBAR PUNCTURE UNDER FLUOROSCOPIC GUIDANCE FLUOROSCOPY TIME:  Radiation Exposure Index (as provided by the fluoroscopic device): 14.5 mGy PROCEDURE: Informed consent was obtained from the patient prior to the procedure, including potential complications of headache, allergy, and pain. With the patient prone, the lower back was prepped with Betadine. 1% Lidocaine was used for local anesthesia. Lumbar puncture was performed at the L3-L4 level using a 22 gauge needle with return of clear CSF with an opening pressure of 14 cm water. Eight ml of CSF were obtained for laboratory studies. The patient tolerated the procedure well and there were no apparent complications. IMPRESSION: Successful fluoroscopically directed lumbar puncture. Electronically Signed   By: Maisie Fus  Register   On: 07/25/2015 08:59    Medications:  I have reviewed the patient's current medications. Prior to Admission:  Prescriptions prior to admission  Medication Sig Dispense Refill Last Dose  . SUMAtriptan (IMITREX) 100 MG tablet Take 100 mg by mouth every 2 (two) hours as needed for migraine. May repeat in 2 hours if headache persists or recurs.      Scheduled: . Influenza vac split quadrivalent PF  0.5 mL Intramuscular Tomorrow-1000    Assessment/Plan: Patient with continued pain and with migraine at this time as well.    Recommendations: 1.  Headache cocktail to consist of Robaxin 500mg , Reglan  10mg  and Benadryl 25mg  now 2.  Start Valium 5mg  q 8hours scheduled.  May increase to 10mg  if necessary.   3.  PT to evaluate patient     Thana Farr, MD Neurology [phone] 07/26/2015  10:03 AM

## 2015-07-27 DIAGNOSIS — G43909 Migraine, unspecified, not intractable, without status migrainosus: Secondary | ICD-10-CM | POA: Diagnosis not present

## 2015-07-27 MED ORDER — CYCLOBENZAPRINE HCL 5 MG PO TABS: 5.0000 mg | ORAL_TABLET | Freq: Three times a day (TID) | ORAL | Status: AC | PRN

## 2015-07-27 MED ORDER — OXYCODONE-ACETAMINOPHEN 5-325 MG PO TABS: 1.0000 | ORAL_TABLET | Freq: Four times a day (QID) | ORAL | Status: AC | PRN

## 2015-07-27 NOTE — Progress Notes (Signed)
Patient refuses flu vaccine and pna vaccine at this present time.

## 2015-07-27 NOTE — Discharge Instructions (Signed)
°  DIET:  °Regular diet ° °DISCHARGE CONDITION:  °Good ° °ACTIVITY:  °Activity as tolerated ° °OXYGEN:  °Home Oxygen: No. °  °Oxygen Delivery: room air ° °DISCHARGE LOCATION:  °home  ° ° °ADDITIONAL DISCHARGE INSTRUCTION: ° ° °If you experience worsening of your admission symptoms, develop shortness of breath, life threatening emergency, suicidal or homicidal thoughts you must seek medical attention immediately by calling 911 or calling your MD immediately  if symptoms less severe. ° °You Must read complete instructions/literature along with all the possible adverse reactions/side effects for all the Medicines you take and that have been prescribed to you. Take any new Medicines after you have completely understood and accpet all the possible adverse reactions/side effects.  ° °Please note ° °You were cared for by a hospitalist during your hospital stay. If you have any questions about your discharge medications or the care you received while you were in the hospital after you are discharged, you can call the unit and asked to speak with the hospitalist on call if the hospitalist that took care of you is not available. Once you are discharged, your primary care physician will handle any further medical issues. Please note that NO REFILLS for any discharge medications will be authorized once you are discharged, as it is imperative that you return to your primary care physician (or establish a relationship with a primary care physician if you do not have one) for your aftercare needs so that they can reassess your need for medications and monitor your lab values. ° ° °

## 2015-07-27 NOTE — Progress Notes (Signed)
Completed discharge instructions with patient and spouse.  Patient able to tolerate standing to shower and will call once finished to discharge to home.

## 2015-07-27 NOTE — Progress Notes (Signed)
[facility] - [place] at [facility]        Jordan Richards was admitted to the Hospital on 07/25/2015 and Discharged  07/27/2015 . His significant other  Jordan Richards was with him in the hospital during this time. Please excuse her from work.    Call Auburn Bilberry MD with questions.  Auburn Bilberry M.D on 07/27/2015,at 10:23 AM  [facility] - [place] at [facility]  [phone]

## 2015-07-27 NOTE — Progress Notes (Signed)
[facility] - [place] at [facility]        Jordan Richards was admitted to the Hospital on 07/25/2015 and Discharged  07/27/2015 and should be excused from work/school   for 5 days starting 07/25/2015 , may return to work/school without any restrictions.  Call Auburn Bilberry MD with questions.  Auburn Bilberry M.D on 07/27/2015,at 10:09 AM  [facility] - [place] at [facility]  [phone]

## 2015-07-28 LAB — JC VIRUS, PCR CSF: JC Virus PCR, CSF: NEGATIVE

## 2015-07-28 LAB — CSF CULTURE: CULTURE: NO GROWTH

## 2015-07-28 LAB — CSF CULTURE W GRAM STAIN

## 2015-07-28 LAB — OLIGOCLONAL BANDS, CSF + SERM

## 2015-07-29 LAB — CSF IGG: IgG, CSF: 1.9 mg/dL (ref 0.0–8.6)

## 2015-07-29 LAB — OLIGOCLONAL BANDS, CSF + SERM

## 2015-07-29 LAB — JC VIRUS, PCR CSF: JC Virus PCR, CSF: NEGATIVE

## 2015-07-29 NOTE — Discharge Summary (Addendum)
Jordan Richards, 34 y.o., DOB 17-Feb-1981, MRN [id number]. Admission date: 07/25/2015 Discharge Date 07/27/2015 Primary MD Perley Jain, Desiree Lucy, MD Admitting Physician Alford Highland, MD  Admission Diagnosis  MS (multiple sclerosis) [facility]) [G35]  Discharge Diagnosis   Active Problems:   Back pain after lumbar puncture Acute migraine attack Abnormal MRI of the brain per neurology felt to be due to migraines         Hospital Course  Jordan Richards is a 35 y.o. male with a known history of migraines. He follows with Dr. Malvin Johns neurology who obtained an MRI of the brain and they saw an area that they could not rule out multiple sclerosis and he was sent for spinal tap. He had a spinal tap  that was done by Dr. register under fluoroscopy. His opening pressure was normal and the procedure was uncomplicated as per Dr. Best boy. The patient had severe back pain and spasms after the procedure. He could not even stand up and bending or twisting has him in severe pain. Patient was admitted to the hospital for symptom control. He was seen by neurology and had a CT scan of his spine which showed no evidence of hematoma post procedure or any other trauma. His symptoms were very atypical. He was tapped in the hospital and was treated with pain medications and antispasm medications. His symptoms started to improve significantly. He was able to ambulate.           Consults  neurology  Significant Tests:  See full reports for all details    Ct Lumbar Spine Wo Contrast  07/25/2015  CLINICAL DATA:  Severe back pain and spasms following lumbar puncture today. Difficulty urinating. The patient denies Pattricia Boss radicular symptoms. EXAM: CT LUMBAR SPINE WITHOUT CONTRAST TECHNIQUE: Multidetector CT imaging of the lumbar spine was performed without intravenous contrast administration. Multiplanar CT image reconstructions were also generated. COMPARISON:  Single oblique view of the lumbar spine from the  fluoroscopic guided lumbar puncture 2 day. FINDINGS: Limited imaging of the abdomen is within normal limits. There is no significant adenopathy. 5 non rib-bearing lumbar type vertebral bodies are present. The vertebral body heights and alignment are maintained. The thecal sac appears to be within normal limits throughout the lumbar spine. There is no significant subdural or epidural collection. There is no distortion of the central canal. The L3-4 level, where the puncture was performed, is unremarkable. L4-5: A mild rightward disc bulge is present. Facet hypertrophy contributes to mild subarticular narrowing bilaterally. L5-S1:  Negative. IMPRESSION: 1. No evidence for significant epidural or subdural collection at the level of the lumbar puncture, L3-4. 2. No significant mass effect to explain the patient's symptoms following lumbar puncture today. 3. Mild rightward disc bulge and bilateral facet hypertrophy contribute to mild bilateral subarticular narrowing. Electronically Signed   By: Marin Roberts M.D.   On: 07/25/2015 15:09   Mr Maxine Glenn Head Wo Contrast  06/29/2015  CLINICAL DATA:  Temporal and posterior headaches. Pain behind the left eye. Posterior neck pain. Worsening symptoms for 10 years. EXAM: MRI HEAD WITHOUT CONTRAST MRA HEAD WITHOUT CONTRAST TECHNIQUE: Multiplanar, multiecho pulse sequences of the brain and surrounding structures were obtained without intravenous contrast. Angiographic images of the head were obtained using MRA technique without contrast. COMPARISON:  None. FINDINGS: MRI HEAD FINDINGS Some sequences are mildly motion degraded. There is no evidence of acute infarct, intracranial hemorrhage, mass, midline shift, or extra-axial fluid collection. Ventricles and sulci are normal. There is a 1.7 x 1.0 cm region  of confluent T2 hyperintensity in the right parietal white matter adjacent to the atrium of the lateral ventricle. Faint left periatrial white matter T2 hyperintensity is  noted. There is a 1 cm focus of subcortical white matter T2 hyperintensity in the posterior right frontal lobe. Orbits are unremarkable. Minimal right ethmoid sinus mucosal thickening is noted. Mastoid air cells are clear. Major intracranial vascular flow voids are preserved. MRA HEAD FINDINGS The study is moderately motion degraded. The intracranial vertebral arteries or not imaged. The vertebrobasilar junction is patent. Basilar artery is patent without evidence of stenosis. SCA origins are patent. PCAs are patent with artifact versus mild stenosis near the right P1 - P2 junction. Internal carotid arteries are patent from skullbase to carotid termini. No flow limiting ICA stenosis is identified, however evaluation is significantly limited by motion artifact, particularly through the petrous and proximal supraclinoid segments. ACAs and MCAs are patent without evidence of significant proximal stenosis within limitations of motion artifact. No sizable intracranial aneurysm is identified, although sensitivity for detection of very small aneurysms is reduced by the degree of motion. IMPRESSION: 1. No acute intracranial abnormality or mass. 2. Cerebral white matter disease which is abnormal for age and most notable in the right periatrial region. This is nonspecific with considerations including chronic small vessel ischemia, demyelinating disease, migraines, sequelae of prior trauma or infection, hypercoagulable state, and vasculitis. 3. Moderately motion degraded MRA without evidence of major intracranial arterial occlusion, flow-limiting proximal stenosis, or sizable aneurysm. Electronically Signed   By: Sebastian Ache M.D.   On: 06/29/2015 13:47   Mr Brain Wo Contrast  06/29/2015  CLINICAL DATA:  Temporal and posterior headaches. Pain behind the left eye. Posterior neck pain. Worsening symptoms for 10 years. EXAM: MRI HEAD WITHOUT CONTRAST MRA HEAD WITHOUT CONTRAST TECHNIQUE: Multiplanar, multiecho pulse sequences  of the brain and surrounding structures were obtained without intravenous contrast. Angiographic images of the head were obtained using MRA technique without contrast. COMPARISON:  None. FINDINGS: MRI HEAD FINDINGS Some sequences are mildly motion degraded. There is no evidence of acute infarct, intracranial hemorrhage, mass, midline shift, or extra-axial fluid collection. Ventricles and sulci are normal. There is a 1.7 x 1.0 cm region of confluent T2 hyperintensity in the right parietal white matter adjacent to the atrium of the lateral ventricle. Faint left periatrial white matter T2 hyperintensity is noted. There is a 1 cm focus of subcortical white matter T2 hyperintensity in the posterior right frontal lobe. Orbits are unremarkable. Minimal right ethmoid sinus mucosal thickening is noted. Mastoid air cells are clear. Major intracranial vascular flow voids are preserved. MRA HEAD FINDINGS The study is moderately motion degraded. The intracranial vertebral arteries or not imaged. The vertebrobasilar junction is patent. Basilar artery is patent without evidence of stenosis. SCA origins are patent. PCAs are patent with artifact versus mild stenosis near the right P1 - P2 junction. Internal carotid arteries are patent from skullbase to carotid termini. No flow limiting ICA stenosis is identified, however evaluation is significantly limited by motion artifact, particularly through the petrous and proximal supraclinoid segments. ACAs and MCAs are patent without evidence of significant proximal stenosis within limitations of motion artifact. No sizable intracranial aneurysm is identified, although sensitivity for detection of very small aneurysms is reduced by the degree of motion. IMPRESSION: 1. No acute intracranial abnormality or mass. 2. Cerebral white matter disease which is abnormal for age and most notable in the right periatrial region. This is nonspecific with considerations including chronic small vessel  ischemia,  demyelinating disease, migraines, sequelae of prior trauma or infection, hypercoagulable state, and vasculitis. 3. Moderately motion degraded MRA without evidence of major intracranial arterial occlusion, flow-limiting proximal stenosis, or sizable aneurysm. Electronically Signed   By: Sebastian Ache M.D.   On: 06/29/2015 13:47   Dg Fluoro Guided Loc Of Needle/cath Tip For Spinal Inject Lt  07/25/2015  CLINICAL DATA:  Headaches. EXAM: DIAGNOSTIC LUMBAR PUNCTURE UNDER FLUOROSCOPIC GUIDANCE FLUOROSCOPY TIME:  Radiation Exposure Index (as provided by the fluoroscopic device): 14.5 mGy PROCEDURE: Informed consent was obtained from the patient prior to the procedure, including potential complications of headache, allergy, and pain. With the patient prone, the lower back was prepped with Betadine. 1% Lidocaine was used for local anesthesia. Lumbar puncture was performed at the L3-L4 level using a 22 gauge needle with return of clear CSF with an opening pressure of 14 cm water. Eight ml of CSF were obtained for laboratory studies. The patient tolerated the procedure well and there were no apparent complications. IMPRESSION: Successful fluoroscopically directed lumbar puncture. Electronically Signed   By: Maisie Fus  Register   On: 07/25/2015 08:59       Today   Subjective:   Jordan Richards  feeling better complains of some back pain but he was able to take a shower and and ambulate in the room  Objective:   Blood pressure 113/73, pulse 67, temperature 97.8 F (36.6 C), temperature source Oral, resp. rate 20, height 6\' 1"  (1.854 m), weight 74.844 kg (165 lb), SpO2 100 %.  . No intake or output data in the 24 hours ending 07/29/15 1326  Exam VITAL SIGNS: Blood pressure 113/73, pulse 67, temperature 97.8 F (36.6 C), temperature source Oral, resp. rate 20, height 6\' 1"  (1.854 m), weight 74.844 kg (165 lb), SpO2 100 %.  GENERAL:  35 y.o.-year-old patient lying in the bed with no acute distress.   EYES: Pupils equal, round, reactive to light and accommodation. No scleral icterus. Extraocular muscles intact.  HEENT: Head atraumatic, normocephalic. Oropharynx and nasopharynx clear.  NECK:  Supple, no jugular venous distention. No thyroid enlargement, no tenderness.  LUNGS: Normal breath sounds bilaterally, no wheezing, rales,rhonchi or crepitation. No use of accessory muscles of respiration.  CARDIOVASCULAR: S1, S2 normal. No murmurs, rubs, or gallops.  ABDOMEN: Soft, nontender, nondistended. Bowel sounds present. No organomegaly or mass.  EXTREMITIES: No pedal edema, cyanosis, or clubbing.  NEUROLOGIC: Cranial nerves II through XII are intact. Muscle strength 5/5 in all extremities. Sensation intact. Gait not checked.  PSYCHIATRIC: The patient is alert and oriented x 3.  SKIN: No obvious rash, lesion, or ulcer.   Data Review     CBC w Diff:  Lab Results  Component Value Date   WBC 6.9 07/26/2015   HGB 16.0 07/26/2015   HCT 46.1 07/26/2015   PLT 215 07/26/2015   CMP:  Lab Results  Component Value Date   NA 141 07/26/2015   K 4.4 07/26/2015   CL 109 07/26/2015   CO2 27 07/26/2015   BUN 19 07/26/2015   CREATININE 0.95 07/26/2015   ALBUMIN 4.5 07/25/2015  .  Micro Results Recent Results (from the past 240 hour(s))  CSF culture     Status: None   Collection Time: 07/25/15  8:20 AM  Result Value Ref Range Status   Specimen Description CSF  Final   Special Requests NONE  Final   Gram Stain RARE WBC SEEN NO ORGANISMS SEEN   Final   Culture NO GROWTH 3 DAYS  Final  Report Status 07/28/2015 FINAL  Final     Code Status History    Date Active Date Inactive Code Status Order ID Comments User Context   07/25/2015 12:47 PM 07/27/2015  3:33 PM Full Code [id number]  Alford Highland, MD HOV          Follow-up Information    Follow up with Alphonzo Lemmings, MD On 08/04/2015.   Specialty:  Neurology   Why:  at 10:45 am in the [facility] office   Contact information:    [address] [facility] [place] [address] [phone]       Discharge Medications     Medication List    TAKE these medications        cyclobenzaprine 5 MG tablet  Commonly known as:  FLEXERIL  Take 1 tablet (5 mg total) by mouth 3 (three) times daily as needed for muscle spasms.     oxyCODONE-acetaminophen 5-325 MG tablet  Commonly known as:  PERCOCET  Take 1 tablet by mouth every 6 (six) hours as needed for moderate pain or severe pain.     SUMAtriptan 100 MG tablet  Commonly known as:  IMITREX  Take 100 mg by mouth every 2 (two) hours as needed for migraine. May repeat in 2 hours if headache persists or recurs.           Total Time in preparing paper work, data evaluation and todays exam - 35 minutes  Auburn Bilberry M.D on 07/29/2015 at 1:26 PM  [facility]   Office  [phone]

## 2017-07-10 IMAGING — CT CT L SPINE W/O CM
1 of 6 series · 7 of 14 positions shown, 9 images · non-contrast
Comparison: Single oblique view of the lumbar spine from the
fluoroscopic guided lumbar puncture 2 day.

CLINICAL DATA: Severe back pain and spasms following lumbar
puncture today. Difficulty urinating. The patient denies [place]
radicular symptoms.

EXAM:
CT LUMBAR SPINE WITHOUT CONTRAST
TECHNIQUE: Multidetector CT imaging of the lumbar spine was performed without
intravenous contrast administration. Multiplanar CT image
reconstructions were also generated.

[Series 3: l spine soft · axial · 0.32mm/px · z∈[-471,-267]mm · 7 of 137 slices shown, 9 images]
[im 18/137  soft-tissue]
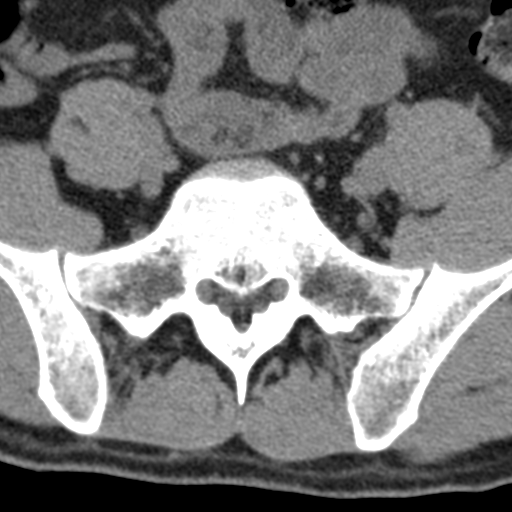
[im 18/137  bone]
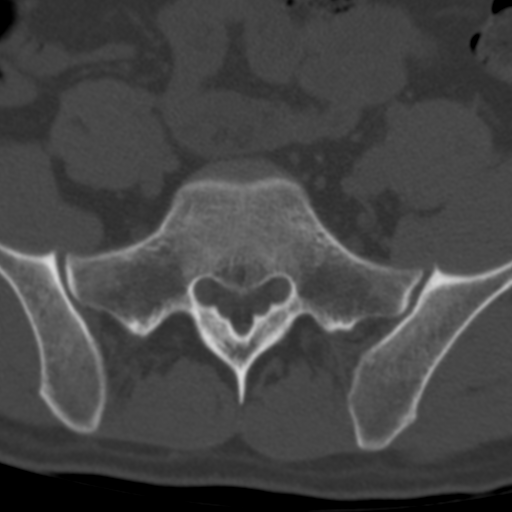
[im 35/137  bone]
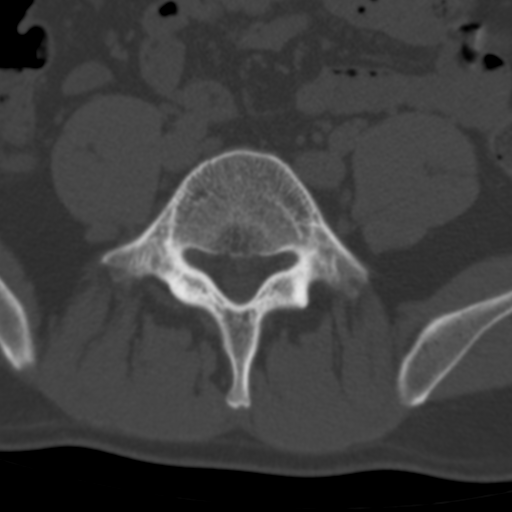
[im 52/137  bone]
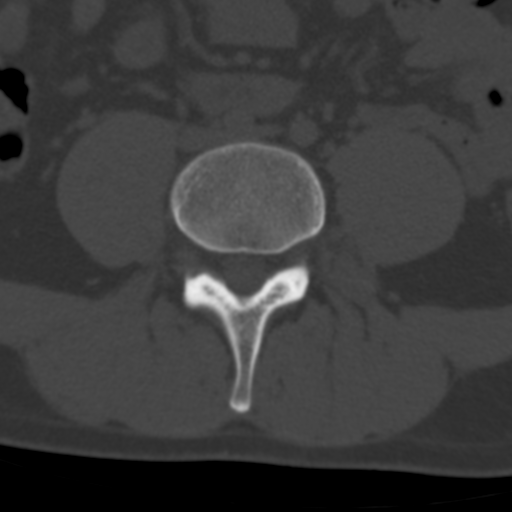
[im 69/137  bone]
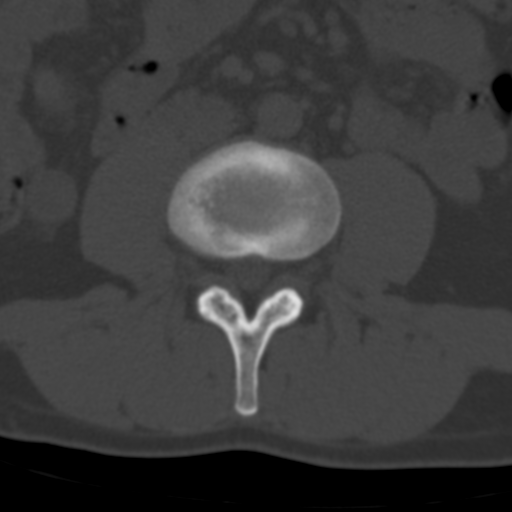
[im 86/137  soft-tissue]
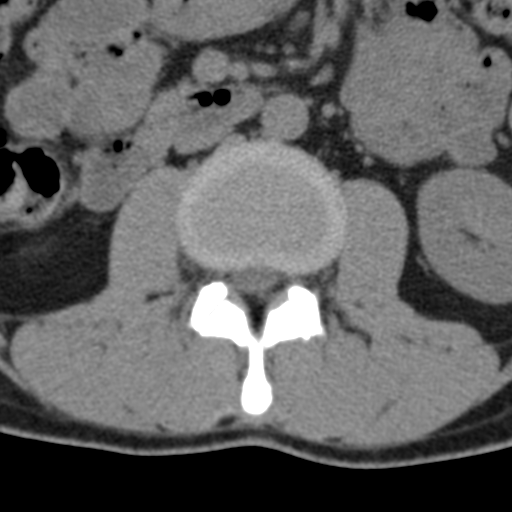
[im 86/137  bone]
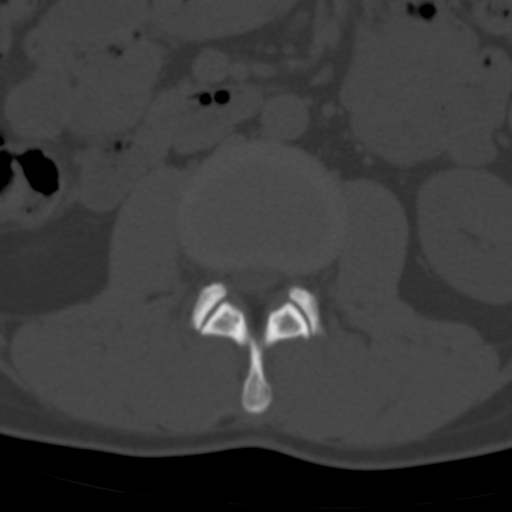
[im 103/137  bone]
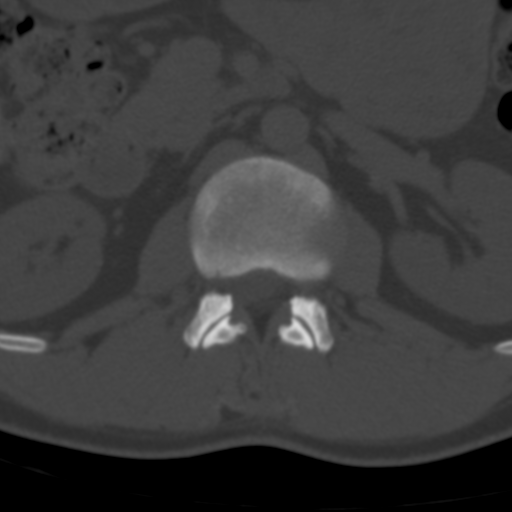
[im 120/137  bone]
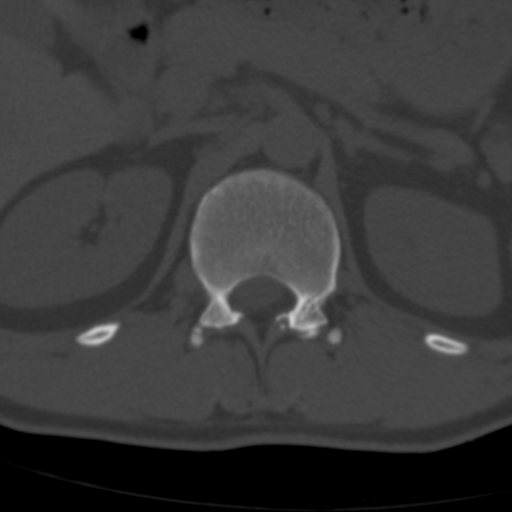

[7 of 14 positions shown; findings below may reference images not displayed]

FINDINGS: Limited imaging of the abdomen is within normal limits. There is no
significant adenopathy. 5 non rib-bearing lumbar type vertebral
bodies are present. The vertebral body heights and alignment are
maintained. The thecal sac appears to be within normal limits
throughout the lumbar spine. There is no significant subdural or
epidural collection. There is no distortion of the central canal.

The L3-4 level, where the puncture was performed, is unremarkable.

L4-5: A mild rightward disc bulge is present. Facet hypertrophy
contributes to mild subarticular narrowing bilaterally.

L5-S1:  Negative.
IMPRESSION: 1. No evidence for significant epidural or subdural collection at
the level of the lumbar puncture, L3-4.
2. No significant mass effect to explain the patient's symptoms
following lumbar puncture today.
3. Mild rightward disc bulge and bilateral facet hypertrophy
contribute to mild bilateral subarticular narrowing.
